# Patient Record
Sex: Male | Born: 1995 | Race: White | Hispanic: No | Marital: Single | Smoking: Former smoker
Health system: Southern US, Community
[De-identification: ages and names within clinical notes are randomized; demographics above are authoritative.]

---
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
## 2014-03-31 ENCOUNTER — Ambulatory Visit: Payer: Self-pay | Admitting: Physician Assistant

## 2014-03-31 ENCOUNTER — Ambulatory Visit: Payer: Self-pay

## 2017-01-13 ENCOUNTER — Emergency Department
Admission: EM | Admit: 2017-01-13 | Discharge: 2017-01-13 | Disposition: A | Discharge status: Home / Self Care | Payer: BC Managed Care – PPO | Attending: Emergency Medicine | Admitting: Emergency Medicine

## 2017-01-13 DIAGNOSIS — F17200 Nicotine dependence, unspecified, uncomplicated: Secondary | ICD-10-CM | POA: Insufficient documentation

## 2017-01-13 DIAGNOSIS — Z9189 Other specified personal risk factors, not elsewhere classified: Secondary | ICD-10-CM

## 2017-01-13 DIAGNOSIS — R1084 Generalized abdominal pain: Secondary | ICD-10-CM | POA: Insufficient documentation

## 2017-01-13 LAB — COMPREHENSIVE METABOLIC PANEL
ALK PHOS: 54 U/L (ref 38–126)
ALT: 20 U/L (ref 17–63)
AST: 39 U/L (ref 15–41)
Albumin: 4.5 g/dL (ref 3.5–5.0)
Anion gap: 9 (ref 5–15)
BILIRUBIN TOTAL: 0.9 mg/dL (ref 0.3–1.2)
BUN: 12 mg/dL (ref 6–20)
CALCIUM: 9.6 mg/dL (ref 8.9–10.3)
CO2: 26 mmol/L (ref 22–32)
CREATININE: 1.07 mg/dL (ref 0.61–1.24)
Chloride: 106 mmol/L (ref 101–111)
GFR calc Af Amer: >60 mL/min (ref >60)
Glucose, Bld: 122 mg/dL — ABNORMAL HIGH (ref 65–99)
POTASSIUM: 3.8 mmol/L (ref 3.5–5.1)
Sodium: 141 mmol/L (ref 135–145)
TOTAL PROTEIN: 7.4 g/dL (ref 6.5–8.1)

## 2017-01-13 LAB — CBC
HEMATOCRIT: 45.1 % (ref 40.0–52.0)
Hemoglobin: 15.9 g/dL (ref 13.0–18.0)
MCH: 33.2 pg (ref 26.0–34.0)
MCHC: 35.3 g/dL (ref 32.0–36.0)
MCV: 94 fL (ref 80.0–100.0)
PLATELETS: 252 10*3/uL (ref 150–440)
RBC: 4.8 MIL/uL (ref 4.40–5.90)
RDW: 12.8 % (ref 11.5–14.5)
WBC: 10 10*3/uL (ref 3.8–10.6)

## 2017-01-13 LAB — URINALYSIS, COMPLETE (UACMP) WITH MICROSCOPIC
BILIRUBIN URINE: NEGATIVE
Bacteria, UA: NONE SEEN
GLUCOSE, UA: NEGATIVE mg/dL
HGB URINE DIPSTICK: NEGATIVE
Ketones, ur: NEGATIVE mg/dL
LEUKOCYTES UA: NEGATIVE
NITRITE: NEGATIVE
PH: 7 (ref 5.0–8.0)
Protein, ur: NEGATIVE mg/dL
SPECIFIC GRAVITY, URINE: 1.009 (ref 1.005–1.030)

## 2017-01-13 LAB — LIPASE, BLOOD: Lipase: 25 U/L (ref 11–51)

## 2017-01-13 MED ORDER — SODIUM CHLORIDE 0.9 % IV BOLUS (SEPSIS): 1000.0000 mL | Freq: Once | INTRAVENOUS | Status: DC

## 2017-01-13 MED ORDER — PROMETHAZINE HCL 25 MG/ML IJ SOLN: 12.5000 mg | Freq: Once | INTRAMUSCULAR | Status: DC

## 2017-01-13 NOTE — ED Notes (Signed)
This RN notified of patient departure from [place], [employer]. Pt left room prior to receiving medications at this time, visualized leaving the ER.

## 2017-01-13 NOTE — ED Notes (Signed)
This RN saw patient storm out of his room on the phone. Called first nurse and they confirmed he walked out the front door. No documented IV

## 2017-01-13 NOTE — ED Provider Notes (Addendum)
[facility] Emergency Department Provider Note  ____________________________________________   I have reviewed the triage vital signs and the nursing notes.   HISTORY  Chief Complaint Abdominal Pain    HPI Erik Acosta is a 21 y.o. male who is healthy. States he was drinking tequila last night, smoking marijuana and eating large amounts of pasta. He woke up feeling nauseated. Had a greenish loose stool and is having abdominal cramps in various parts of his abd. No pain at the moment. No fever no chills no vomiting.   No testicle pain nor swelling no dysuria.    No past medical history on file.  There are no active problems to display for this patient.   No past surgical history on file.  Prior to Admission medications   Not on File    Allergies Patient has no known allergies.  No family history on file.  Social History Social History  Substance Use Topics  . Smoking status: Current Every Day Smoker  . Smokeless tobacco: Not on file  . Alcohol use No    Review of Systems Constitutional: No fever/chills Eyes: No visual changes. ENT: No sore throat. No stiff neck no neck pain Cardiovascular: Denies chest pain. Respiratory: Denies shortness of breath. Gastrointestinal:   no vomiting.  + diarrhea.  No constipation. Genitourinary: Negative for dysuria. Musculoskeletal: Negative lower extremity swelling Skin: Negative for rash. Neurological: Negative for severe headaches, focal weakness or numbness.   ____________________________________________   PHYSICAL EXAM:  VITAL SIGNS: ED Triage Vitals  Enc Vitals Group     BP 01/13/17 1220 (!) 145/89     Pulse Rate 01/13/17 1220 86     Resp 01/13/17 1220 19     Temp 01/13/17 1220 98.6 F (37 C)     Temp Source 01/13/17 1220 Oral     SpO2 01/13/17 1220 99 %     Weight 01/13/17 1221 155 lb (70.3 kg)     Height 01/13/17 1221 5\' 8"  (1.727 m)     Head Circumference --      Peak  Flow --      Pain Score --      Pain Loc --      Pain Edu? --      Excl. in GC? --     Constitutional: Alert and oriented. Well appearing and in no acute distress. Eyes: Conjunctivae are normal Head: Atraumatic HEENT: No congestion/rhinnorhea. Mucous membranes are moist.  Oropharynx non-erythematous Neck:   Nontender with no meningismus, no masses, no stridor Cardiovascular: Normal rate, regular rhythm. Grossly normal heart sounds.  Good peripheral circulation. Respiratory: Normal respiratory effort.  No retractions. Lungs CTAB. Abdominal: Soft and nontender. No distention. No guarding no rebound Back:  There is no focal tenderness or step off.  there is no midline tenderness there are no lesions noted. there is no CVA tenderness GU: normal male GU no evidence of pain swelling or lesions.  Musculoskeletal: No lower extremity tenderness, no upper extremity tenderness. No joint effusions, no DVT signs strong distal pulses no edema Neurologic:  Normal speech and language. No gross focal neurologic deficits are appreciated.  Skin:  Skin is warm, dry and intact. No rash noted. Psychiatric: Mood and affect are normal. Speech and behavior are normal.  ____________________________________________   LABS (all labs ordered are listed, but only abnormal results are displayed)  Labs Reviewed  COMPREHENSIVE METABOLIC PANEL - Abnormal; Notable for the following:       Result Value   Glucose,  Bld 122 (*)    All other components within normal limits  URINALYSIS, COMPLETE (UACMP) WITH MICROSCOPIC - Abnormal; Notable for the following:    Color, Urine YELLOW (*)    APPearance CLEAR (*)    Squamous Epithelial / LPF 0-5 (*)    All other components within normal limits  LIPASE, BLOOD  CBC   ____________________________________________  EKG  I personally interpreted any EKGs ordered by me or triage ____________________________________________  RADIOLOGY  I reviewed any imaging ordered by  me or triage that were performed during my shift and, if possible, patient and/or family made aware of any abnormal findings. ____________________________________________   PROCEDURES  Procedure(s) performed: None  Procedures  Critical Care performed: None  ____________________________________________   INITIAL IMPRESSION / ASSESSMENT AND PLAN / ED COURSE  Pertinent labs & imaging results that were available during my care of the patient were reviewed by me and considered in my medical decision making (see chart for details).  Well appearing pt with abd cramps and non tender abd. Will give nausea meds and ivf.  Blood work reassuring.   ----------------------------------------- 3:03 PM on 01/13/2017 -----------------------------------------  Appraised by nursing that pt eloped.    ____________________________________________   FINAL CLINICAL IMPRESSION(S) / ED DIAGNOSES  Final diagnoses:  None      This chart was dictated using voice recognition software.  Despite best efforts to proofread,  errors can occur which can change meaning.      Jeanmarie Plant, MD 01/13/17 1413    Jeanmarie Plant, MD 01/13/17 [phone]

## 2017-01-13 NOTE — ED Triage Notes (Signed)
Pt presents to ED c/o lower abdominal pain described as "knife cutting through my stomach" since this morning. Pt States he woke up and had severe pain, tried to have a bowel movement without relief. Pt admits to smoking marijuana last night, drinking tequila , and eating a large amount of pasta last night.

## 2017-07-25 ENCOUNTER — Ambulatory Visit: Payer: BC Managed Care – PPO | Admitting: Physician Assistant

## 2017-07-25 ENCOUNTER — Encounter: Payer: Self-pay | Admitting: Physician Assistant

## 2017-07-25 VITALS — BP 136/72 | HR 62 | Temp 98.3°F | Resp 16 | Ht 68.0 in | Wt 152.0 lb

## 2017-07-25 DIAGNOSIS — X58XXXA Exposure to other specified factors, initial encounter: Secondary | ICD-10-CM

## 2017-07-25 DIAGNOSIS — Z131 Encounter for screening for diabetes mellitus: Secondary | ICD-10-CM

## 2017-07-25 DIAGNOSIS — Z23 Encounter for immunization: Secondary | ICD-10-CM | POA: Diagnosis not present

## 2017-07-25 DIAGNOSIS — Z13220 Encounter for screening for lipoid disorders: Secondary | ICD-10-CM | POA: Diagnosis not present

## 2017-07-25 DIAGNOSIS — Z1329 Encounter for screening for other suspected endocrine disorder: Secondary | ICD-10-CM

## 2017-07-25 DIAGNOSIS — Z0000 Encounter for general adult medical examination without abnormal findings: Secondary | ICD-10-CM | POA: Diagnosis not present

## 2017-07-25 DIAGNOSIS — M25561 Pain in right knee: Secondary | ICD-10-CM | POA: Diagnosis not present

## 2017-07-25 DIAGNOSIS — Z130 Encounter for screening for diseases of the blood and blood-forming organs and certain disorders involving the immune mechanism: Secondary | ICD-10-CM

## 2017-07-25 NOTE — Patient Instructions (Signed)

## 2017-07-25 NOTE — Progress Notes (Signed)
Patient: Erik CoupChristopher J Acosta Male    DOB: 05/07/1996   21 y.o.   MRN: [id number] Visit Date: 07/25/2017  Today's Provider: Trey SailorsAdriana M Pollak, PA-C   Chief Complaint  Patient presents with  . New Patient (Initial Visit)  . Joint Pain   Subjective:    HPI   Erik Acosta is a 22 y/o male presenting today to establish care. He was [facility] but has not had a PCP since.   He is currently living in [place]. He has recently separated from his partner. He has a two and a half year old son who has no illness.   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  He currently smokes marijuana. He stopped smoking cigarettes recently. He has a history of using IV heroin and overdosed in 2014. He says he has not used it since. He has last used cocaine two months ago.   He reports he has right knee pain when he hits a lump on his right knee. Otherwise, no injuries, no surgeries. No other pain in his joints.      No Known Allergies  No current outpatient medications on file.  Review of Systems  Family History  Problem Relation Age of Onset  . Depression Mother    No past surgical history on file.  Social History   Tobacco Use  . Smoking status: Current Every Day Smoker  . Smokeless tobacco: Never Used  Substance Use Topics  . Alcohol use: No   Objective:   BP 136/72 (BP Location: Right Arm, Patient Position: Sitting, Cuff Size: Normal)   Pulse 62   Temp 98.3 F (36.8 C)   Resp 16   Ht 5\' 8"  (1.727 m)   Wt 152 lb (68.9 kg)   SpO2 98%   BMI 23.11 kg/m  Vitals:   07/25/17 0903  BP: 136/72  Pulse: 62  Resp: 16  Temp: 98.3 F (36.8 C)  SpO2: 98%  Weight: 152 lb (68.9 kg)  Height: 5\' 8"  (1.727 m)     Physical Exam  Constitutional: He is oriented to person, place, and time. He appears well-developed and well-nourished.  HENT:  Right Ear: External ear normal.  Left Ear: External ear normal.    Mouth/Throat: Oropharynx is clear and moist. No oropharyngeal exudate.  Eyes: Conjunctivae are normal.  Neck: Neck supple.  Cardiovascular: Normal rate, regular rhythm and normal heart sounds.  Pulmonary/Chest: Effort normal and breath sounds normal.  Abdominal: Soft. Bowel sounds are normal.  Lymphadenopathy:    He has no cervical adenopathy.  Neurological: He is alert and oriented to person, place, and time.  Skin: Skin is warm and dry.  Psychiatric: He has a normal mood and affect. His behavior is normal.        Assessment & Plan:     1. Annual physical exam   2. Exposure to needle, initial encounter  - HIV antibody (with reflex) - Hepatitis c antibody (reflex)  3. Acute pain of right knee  - DG Knee Complete 4 Views Right; Future  4. Screening cholesterol level  - Lipid Profile  5. Screening, anemia, deficiency, iron  - CBC with Differential  6. Diabetes mellitus screening  - Comprehensive Metabolic Panel (CMET)  7. Screening for thyroid disorder  - TSH  8. Need for HPV vaccine  - HPV 9-valent vaccine,Recombinat  The entirety of the information documented in the History of  Present Illness, Review of Systems and Physical Exam were personally obtained by me. Portions of this information were initially documented by Thresa Ross, CMA and reviewed by me for thoroughness and accuracy.           Trey Sailors, PA-C  [facility]

## 2017-07-26 ENCOUNTER — Telehealth: Payer: Self-pay

## 2017-07-26 LAB — COMPREHENSIVE METABOLIC PANEL
ALT: 13 IU/L (ref 0–44)
AST: 19 IU/L (ref 0–40)
Albumin/Globulin Ratio: 2 (ref 1.2–2.2)
Albumin: 4.7 g/dL (ref 3.5–5.5)
Alkaline Phosphatase: 50 IU/L (ref 39–117)
BUN/Creatinine Ratio: 10 (ref 9–20)
BUN: 10 mg/dL (ref 6–20)
Bilirubin Total: 0.6 mg/dL (ref 0.0–1.2)
CO2: 25 mmol/L (ref 20–29)
Calcium: 9.6 mg/dL (ref 8.7–10.2)
Chloride: 104 mmol/L (ref 96–106)
Creatinine, Ser: 0.97 mg/dL (ref 0.76–1.27)
GFR calc Af Amer: 128 mL/min/{1.73_m2} (ref >59)
GFR calc non Af Amer: 110 mL/min/{1.73_m2} (ref >59)
Globulin, Total: 2.3 g/dL (ref 1.5–4.5)
Glucose: 83 mg/dL (ref 65–99)
Potassium: 4.3 mmol/L (ref 3.5–5.2)
Sodium: 144 mmol/L (ref 134–144)
Total Protein: 7 g/dL (ref 6.0–8.5)

## 2017-07-26 LAB — CBC WITH DIFFERENTIAL/PLATELET
Basophils Absolute: 0 10*3/uL (ref 0.0–0.2)
Basos: 0 %
EOS (ABSOLUTE): 0.1 10*3/uL (ref 0.0–0.4)
Eos: 1 %
Hematocrit: 44.7 % (ref 37.5–51.0)
Hemoglobin: 14.9 g/dL (ref 13.0–17.7)
Immature Grans (Abs): 0 10*3/uL (ref 0.0–0.1)
Immature Granulocytes: 0 %
Lymphocytes Absolute: 1.6 10*3/uL (ref 0.7–3.1)
Lymphs: 19 %
MCH: 32.6 pg (ref 26.6–33.0)
MCHC: 33.3 g/dL (ref 31.5–35.7)
MCV: 98 fL — ABNORMAL HIGH (ref 79–97)
Monocytes Absolute: 0.7 10*3/uL (ref 0.1–0.9)
Monocytes: 9 %
Neutrophils Absolute: 5.8 10*3/uL (ref 1.4–7.0)
Neutrophils: 71 %
Platelets: 276 10*3/uL (ref 150–379)
RBC: 4.57 x10E6/uL (ref 4.14–5.80)
RDW: 12.9 % (ref 12.3–15.4)
WBC: 8.3 10*3/uL (ref 3.4–10.8)

## 2017-07-26 LAB — LIPID PANEL
Chol/HDL Ratio: 2.3 ratio (ref 0.0–5.0)
Cholesterol, Total: 133 mg/dL (ref 100–199)
HDL: 57 mg/dL (ref >39)
LDL Calculated: 60 mg/dL (ref 0–99)
Triglycerides: 80 mg/dL (ref 0–149)
VLDL Cholesterol Cal: 16 mg/dL (ref 5–40)

## 2017-07-26 LAB — HIV ANTIBODY (ROUTINE TESTING W REFLEX): HIV Screen 4th Generation wRfx: NONREACTIVE

## 2017-07-26 LAB — TSH: TSH: 1.2 u[IU]/mL (ref 0.450–4.500)

## 2017-07-26 LAB — HEPATITIS C ANTIBODY (REFLEX): HCV Ab: <0.1 s/co ratio (ref 0.0–0.9)

## 2017-07-26 LAB — HCV COMMENT:

## 2017-07-26 NOTE — Telephone Encounter (Signed)
LMTCB 07/26/2017  Thanks,   -Laura  

## 2017-07-26 NOTE — Telephone Encounter (Signed)
-----   Message from Trey SailorsAdriana M Pollak, [place] sent at 07/26/2017  1:40 PM EST ----- Labwork normal. HIV and hepatitis C nonreactive.

## 2017-07-29 NOTE — Telephone Encounter (Signed)
Pt advised.   Thanks,   -Sejal Cofield  

## 2017-08-15 ENCOUNTER — Ambulatory Visit: Payer: BC Managed Care – PPO | Admitting: Physician Assistant

## 2017-08-15 ENCOUNTER — Encounter: Payer: Self-pay | Admitting: Physician Assistant

## 2017-08-15 VITALS — BP 136/72 | HR 68 | Temp 98.6°F | Resp 16 | Wt 153.0 lb

## 2017-08-15 DIAGNOSIS — R454 Irritability and anger: Secondary | ICD-10-CM

## 2017-08-15 DIAGNOSIS — F32A Depression, unspecified: Secondary | ICD-10-CM

## 2017-08-15 DIAGNOSIS — F329 Major depressive disorder, single episode, unspecified: Secondary | ICD-10-CM

## 2017-08-15 NOTE — Progress Notes (Signed)
Patient: Erik Acosta Male    DOB: [birth year]   22 y.o.   MRN: [id number] Visit Date: 08/15/2017  Today's Provider: Trey Sailors, PA-C   Chief Complaint  Patient presents with  . Depression   Subjective:    Erik Acosta is a 22 y/o man presenting today with depression. Saw him last time in clinic where he said he was exercising and improving his diet which had improved his mood. Did not want to seek treatment at that time. He does not answer some of the PHQ 9 questions today because he does not like them  He reports uncontrollable rage. He says he went into a fit of anger today because of sound. He says he feels anger pumping through his veins. He says he doesn't listen to people and doesn't quite care what they say. He would like to see a psychiatrist.  Depression         This is a chronic (Pt says this has been going on all of his life.) problem.  The current episode started more than 1 year ago.   The problem has been gradually worsening since onset.  Associated symptoms include decreased concentration, fatigue, irritable, decreased interest and suicidal ideas.  Associated symptoms include no helplessness, no hopelessness, does not have insomnia, no restlessness, no appetite change, no body aches, no myalgias, no headaches, no indigestion and not sad.  Past treatments include nothing.      No Known Allergies  No current outpatient medications on file.  Review of Systems  Constitutional: Positive for fatigue. Negative for appetite change.  Musculoskeletal: Negative for myalgias.  Neurological: Negative for headaches.  Psychiatric/Behavioral: Positive for decreased concentration, depression, dysphoric mood and suicidal ideas. Negative for agitation, behavioral problems, confusion, hallucinations, self-injury and sleep disturbance. The patient is not nervous/anxious, does not have insomnia and is not hyperactive.     Social History   Tobacco Use  .  Smoking status: Former Smoker    Last attempt to quit: 06/21/2017    Years since quitting: 0.1  . Smokeless tobacco: Never Used  Substance Use Topics  . Alcohol use: No   Objective:   BP 136/72 (BP Location: Right Arm, Patient Position: Sitting, Cuff Size: Normal)   Pulse 68   Temp 98.6 F (37 C) (Oral)   Resp 16   Wt 153 lb (69.4 kg)   BMI 23.26 kg/m  Vitals:   08/15/17 1105  BP: 136/72  Pulse: 68  Resp: 16  Temp: 98.6 F (37 C)  TempSrc: Oral  Weight: 153 lb (69.4 kg)     Physical Exam  Constitutional: He is oriented to person, place, and time. He appears well-developed and well-nourished. He is irritable.  Cardiovascular: Normal rate.  Neurological: He is alert and oriented to person, place, and time.  Skin: Skin is warm and dry.  Psychiatric: He has a normal mood and affect. His behavior is normal.        Assessment & Plan:     1. Anger  Will refer to psychiatry today, prefers male psychiatrist.  - Ambulatory referral to Psychiatry  2. Depression, unspecified depression type  - Ambulatory referral to Psychiatry  Return if symptoms worsen or fail to improve.  The entirety of the information documented in the History of Present Illness, Review of Systems and Physical Exam were personally obtained by me. Portions of this information were initially documented by Kavin Leech, CMA and reviewed by me for thoroughness and  accuracy.        Trey SailorsAdriana M Pollak, PA-C  [facility]

## 2017-08-15 NOTE — Patient Instructions (Signed)
Depression Screening Depression screening is a tool that your health care provider can use to learn if you have symptoms of depression. Depression is a common condition with many symptoms that are also often found in other conditions. Depression is treatable, but it must first be diagnosed. You may not know that certain feelings, thoughts, and behaviors that you are having can be symptoms of depression. Taking a depression screening test can help you and your health care provider decide if you need more assessment, or if you should be referred to a mental health care provider. What are the screening tests?  You may have a physical exam to see if another condition is affecting your mental health. You may have a blood or urine sample taken during the physical exam.  You may be interviewed using a screening tool that was developed from research, such as one of these: ? Patient Health Questionnaire (PHQ). This is a set of either 2 or 9 questions. A health care provider who has been trained to score this screening test uses a guide to assess if your symptoms suggest that you may have depression. ? Hamilton Depression Rating Scale (HAM-D). This is a set of either 17 or 24 questions. You may be asked to take it again during or after your treatment, to see if your depression has gotten better. ? Beck Depression Inventory (BDI). This is a set of 21 multiple choice questions. Your health care provider scores your answers to assess:  Your level of depression, ranging from mild to severe.  Your response to treatment.  Your health care provider may talk with you about your daily activities, such as eating, sleeping, work, and recreation, and ask if you have had any changes in activity.  Your health care provider may ask you to see a mental health specialist, such as a psychiatrist or psychologist, for more evaluation. Who should be screened for depression?  All adults, including adults with a family history  of a mental health disorder.  Adolescents who are 12-18 years old.  People who are recovering from a myocardial infarction (MI).  Pregnant women, or women who have given birth.  People who have a long-term (chronic) illness.  Anyone who has been diagnosed with another type of a mental health disorder.  Anyone who has symptoms that could show depression. What do my results mean? Your health care provider will review the results of your depression screening, physical exam, and lab tests. Positive screens suggest that you may have depression. Screening is the first step in getting the care that you may need. It is up to you to get your screening results. Ask your health care provider, or the department that is doing your screening tests, when your results will be ready. Talk with your health care provider about your results and diagnosis. A diagnosis of depression is made using the Diagnostic and Statistical Manual of Mental Disorders (DSM-V). This is a book that lists the number and type of symptoms that must be present for a health care provider to give a specific diagnosis.  Your health care provider may work with you to treat your symptoms of depression, or your health care provider may help you find a mental health provider who can assess, diagnose, and treat your depression. Get help right away if:  You have thoughts about hurting yourself or others. If you ever feel like you may hurt yourself or others, or have thoughts about taking your own life, get help right away. You can   go to your nearest emergency department or call:  Your local emergency services (911 in the [place]  A suicide crisis helpline, such as the National Suicide Prevention Lifeline at 1-800-273-8255. This is open 24 hours a day.  Summary  Depression screening is the first step in getting the help that you may need.  If your screening test shows symptoms of depression (is positive), your health care provider may ask  you to see a mental health provider.  Anyone who is age 12 or older should be screened for depression. This information is not intended to replace advice given to you by your health care provider. Make sure you discuss any questions you have with your health care provider. Document Released: 09/21/2016 Document Revised: 09/21/2016 Document Reviewed: 09/21/2016 Elsevier Interactive Patient Education  2018 [employer]  

## 2017-10-04 ENCOUNTER — Ambulatory Visit: Payer: Self-pay | Admitting: Physician Assistant

## 2018-01-03 ENCOUNTER — Ambulatory Visit: Payer: BC Managed Care – PPO | Admitting: Psychiatry

## 2022-06-06 ENCOUNTER — Ambulatory Visit
Admission: EM | Admit: 2022-06-06 | Discharge: 2022-06-06 | Disposition: A | Discharge status: Home / Self Care | Payer: BC Managed Care – PPO | Attending: Nurse Practitioner | Admitting: Nurse Practitioner

## 2022-06-06 DIAGNOSIS — S0501XA Injury of conjunctiva and corneal abrasion without foreign body, right eye, initial encounter: Secondary | ICD-10-CM

## 2022-06-06 MED ORDER — OFLOXACIN 0.3 % OP SOLN: 1.0000 [drp] | Freq: Four times a day (QID) | OPHTHALMIC | 0 refills | Status: AC

## 2022-06-06 NOTE — ED Triage Notes (Signed)
Pt c/o foreign body entering L eye x1 day, states it was either a metal or wood particle. Also c/o pain,burning & eye being watery.

## 2022-06-06 NOTE — Discharge Instructions (Signed)
Ofloxacin antibiotic eyedrops as prescribed Do not wear contacts until your symptoms have completely resolved and your treatment is completed Please follow-up with ophthalmology in 2 days for recheck Please go to the emergency room if you develop any worsening symptoms
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
